# Patient Record
Sex: Female | Born: 1980 | Race: Black or African American | Hispanic: No | Marital: Single | Smoking: Never smoker
Health system: Southern US, Community
[De-identification: ages and names within clinical notes are randomized; demographics above are authoritative.]

## PROBLEM LIST (undated history)

## (undated) DIAGNOSIS — D219 Benign neoplasm of connective and other soft tissue, unspecified: Secondary | ICD-10-CM

## (undated) DIAGNOSIS — D649 Anemia, unspecified: Secondary | ICD-10-CM

---

## 2010-03-05 ENCOUNTER — Emergency Department (HOSPITAL_COMMUNITY): Admission: EM | Admit: 2010-03-05 | Discharge: 2010-03-05 | Payer: Self-pay | Admitting: Emergency Medicine

## 2010-05-04 ENCOUNTER — Emergency Department (HOSPITAL_COMMUNITY): Admission: EM | Admit: 2010-05-04 | Discharge: 2010-05-04 | Payer: Self-pay | Admitting: Emergency Medicine

## 2010-12-10 ENCOUNTER — Emergency Department (INDEPENDENT_AMBULATORY_CARE_PROVIDER_SITE_OTHER): Payer: BC Managed Care – PPO

## 2010-12-10 ENCOUNTER — Emergency Department (HOSPITAL_BASED_OUTPATIENT_CLINIC_OR_DEPARTMENT_OTHER)
Admission: EM | Admit: 2010-12-10 | Discharge: 2010-12-10 | Disposition: A | Discharge status: Home / Self Care | Payer: BC Managed Care – PPO | Attending: Emergency Medicine | Admitting: Emergency Medicine

## 2010-12-10 DIAGNOSIS — R42 Dizziness and giddiness: Secondary | ICD-10-CM

## 2010-12-10 DIAGNOSIS — R51 Headache: Secondary | ICD-10-CM

## 2010-12-10 LAB — GRAM STAIN

## 2010-12-10 LAB — CSF CELL COUNT WITH DIFFERENTIAL

## 2010-12-10 LAB — GLUCOSE, CSF: Glucose, CSF: 47 mg/dL (ref 43–76)

## 2010-12-13 LAB — CSF CULTURE W GRAM STAIN: Culture: NO GROWTH

## 2011-03-07 ENCOUNTER — Emergency Department (HOSPITAL_BASED_OUTPATIENT_CLINIC_OR_DEPARTMENT_OTHER)
Admission: EM | Admit: 2011-03-07 | Discharge: 2011-03-07 | Disposition: A | Discharge status: Home / Self Care | Payer: BC Managed Care – PPO | Attending: Emergency Medicine | Admitting: Emergency Medicine

## 2011-03-07 DIAGNOSIS — H6120 Impacted cerumen, unspecified ear: Secondary | ICD-10-CM | POA: Insufficient documentation

## 2011-09-28 IMAGING — CR DG CHEST 2V
2 series · 2 of 2 positions shown · non-contrast
Comparison: None.

CLINICAL DATA: Fever, chills, headache, chest pain for 2-4 days

CHEST - 2 VIEW

[w chest pa]
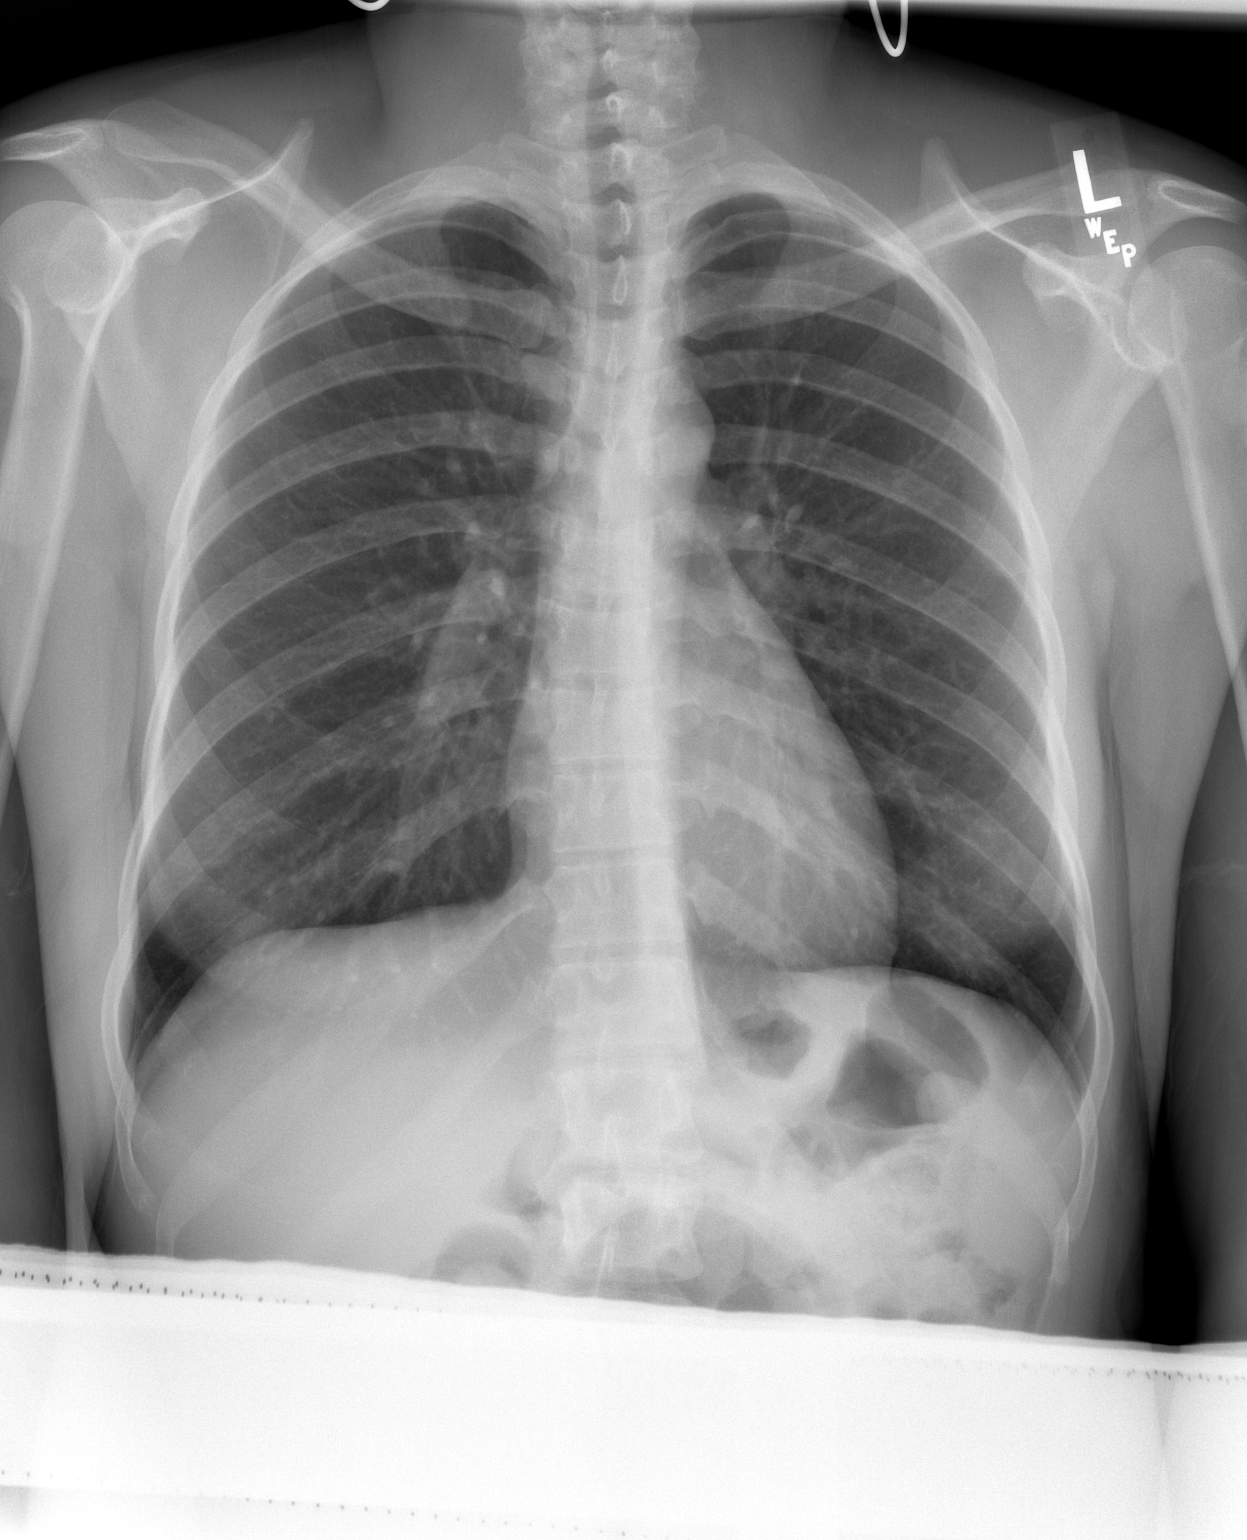

[w chest lat]
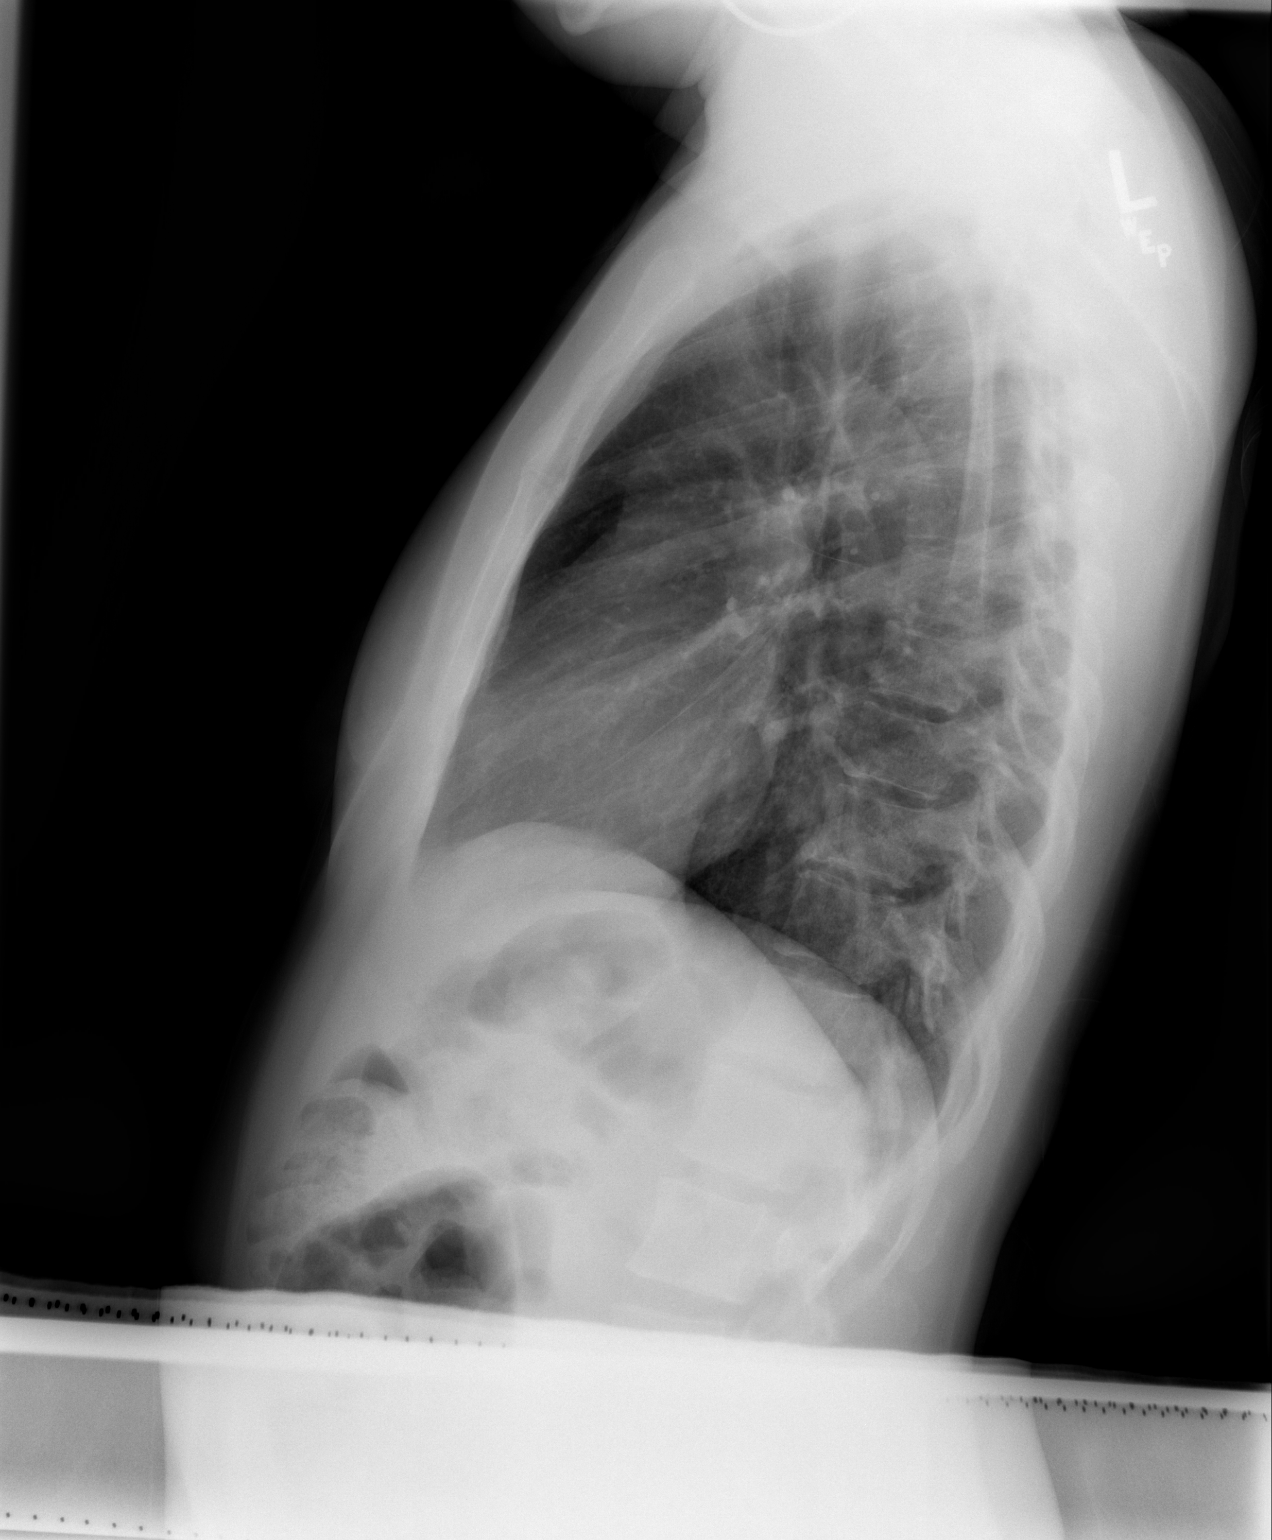

[2 of 2 positions shown; findings below may reference images not displayed]

FINDINGS: The lungs are clear.  Mediastinal contours are normal.
The heart is within normal limits in size.  No acute bony
abnormality is seen.
IMPRESSION: No active lung disease.

## 2014-09-11 ENCOUNTER — Emergency Department (HOSPITAL_BASED_OUTPATIENT_CLINIC_OR_DEPARTMENT_OTHER): Payer: BC Managed Care – PPO

## 2014-09-11 ENCOUNTER — Encounter (HOSPITAL_BASED_OUTPATIENT_CLINIC_OR_DEPARTMENT_OTHER): Payer: Self-pay | Admitting: Emergency Medicine

## 2014-09-11 DIAGNOSIS — Z8742 Personal history of other diseases of the female genital tract: Secondary | ICD-10-CM | POA: Insufficient documentation

## 2014-09-11 DIAGNOSIS — J069 Acute upper respiratory infection, unspecified: Secondary | ICD-10-CM | POA: Diagnosis not present

## 2014-09-11 DIAGNOSIS — R05 Cough: Secondary | ICD-10-CM | POA: Diagnosis present

## 2014-09-11 DIAGNOSIS — Z862 Personal history of diseases of the blood and blood-forming organs and certain disorders involving the immune mechanism: Secondary | ICD-10-CM | POA: Insufficient documentation

## 2014-09-11 DIAGNOSIS — Z3202 Encounter for pregnancy test, result negative: Secondary | ICD-10-CM | POA: Insufficient documentation

## 2014-09-11 DIAGNOSIS — M549 Dorsalgia, unspecified: Secondary | ICD-10-CM | POA: Diagnosis not present

## 2014-09-11 NOTE — ED Notes (Signed)
Pt reports cold symptom x1 month worsen over last several days

## 2014-09-11 NOTE — ED Provider Notes (Signed)
CSN: [id number]     Arrival date & time 09/11/14  2254 History  This chart was scribed for Gloria Hawes Alfonso Patten, MD by Edison Simon, ED Scribe. This patient was seen in room MH04/MH04 and the patient's care was started at 12:25 AM.    Chief Complaint  Patient presents with  . Influenza   Patient is a 33 y.o. female presenting with flu symptoms. The history is provided by the patient. No language interpreter was used.  Influenza Presenting symptoms: cough   Presenting symptoms: no fever, no myalgias, no shortness of breath, no sore throat and no vomiting   Severity:  Moderate Onset quality:  Gradual Duration: 1 month. Progression:  Worsening Chronicity:  New Relieved by: Advil, Robitussin. Worsened by:  Nothing tried Ineffective treatments:  None tried Associated symptoms: nasal congestion   Associated symptoms: no neck stiffness   Risk factors: no kidney disease     HPI Comments: [name] is a 33 y.o. female who presents to the Emergency Department complaining of cough with onset 1 month ago, worse today. She also complains of nasal congestion. She states she used some Advil at home with slight improvement and has used Robitussin also.  Past Medical History  Diagnosis Date  . Anemia   . Fibroid    History reviewed. No pertinent past surgical history. History reviewed. No pertinent family history. History  Substance Use Topics  . Smoking status: Never Smoker   . Smokeless tobacco: Never Used  . Alcohol Use: Yes   OB History    No data available     Review of Systems  Constitutional: Negative for fever.  HENT: Positive for congestion. Negative for sore throat.   Eyes: Negative for photophobia.  Respiratory: Positive for cough. Negative for shortness of breath.   Gastrointestinal: Negative for vomiting.  Musculoskeletal: Positive for back pain. Negative for myalgias and neck stiffness.  All other systems reviewed and are negative.     Allergies  Review of  patient's allergies indicates no known allergies.  Home Medications   Prior to Admission medications   Not on File   BP 114/63 mmHg  Pulse 75  Temp(Src) 98.4 F (36.9 C) (Oral)  Resp 18  Ht 4\' 10"  (1.473 m)  Wt 135 lb (61.236 kg)  BMI 28.22 kg/m2  SpO2 98%  LMP 09/04/2014 Physical Exam  Constitutional: She is oriented to person, place, and time. She appears well-developed and well-nourished.  HENT:  Head: Normocephalic and atraumatic.  Right Ear: Tympanic membrane normal.  Left Ear: Tympanic membrane normal.  Mouth/Throat: Oropharynx is clear and moist. No oropharyngeal exudate.  Clear colorless postnasal drip with cobblestoning  Eyes: Conjunctivae are normal. Pupils are equal, round, and reactive to light.  Neck: Normal range of motion. Neck supple. No tracheal deviation present.  Cardiovascular: Normal rate, regular rhythm and intact distal pulses.   Pulmonary/Chest: Effort normal and breath sounds normal. No respiratory distress. She has no wheezes. She has no rales.  Abdominal: Soft. Bowel sounds are normal. She exhibits no distension. There is no tenderness. There is no rebound and no guarding.  Musculoskeletal: Normal range of motion.  Lymphadenopathy:    She has no cervical adenopathy.  Neurological: She is alert and oriented to person, place, and time.  Skin: Skin is warm and dry.  Psychiatric: She has a normal mood and affect.  Nursing note and vitals reviewed.   ED Course  Procedures (including critical care time)  DIAGNOSTIC STUDIES: Oxygen Saturation is 98% on room air,  normal by my interpretation.    COORDINATION OF CARE: 12:27 AM Discussed treatment plan with patient at beside, the patient agrees with the plan and has no further questions at this time.   Labs Review Labs Reviewed  PREGNANCY, URINE  URINALYSIS, ROUTINE W REFLEX MICROSCOPIC    Imaging Review No results found.   EKG Interpretation None      MDM   Final diagnoses:  URI  (upper respiratory infection)   No fevers, symptoms have been going on for a month and are consistent with post nasal drip.  Without fevers or nasal discharge there are not indications for antibiotics will treat for pain and congestion.    I personally performed the services described in this documentation, which was scribed in my presence. The recorded information has been reviewed and is accurate.     Carlisle Beers, MD 09/12/14 0111

## 2014-09-12 ENCOUNTER — Emergency Department (HOSPITAL_BASED_OUTPATIENT_CLINIC_OR_DEPARTMENT_OTHER)
Admission: EM | Admit: 2014-09-12 | Discharge: 2014-09-12 | Disposition: A | Discharge status: Home / Self Care | Payer: BC Managed Care – PPO | Attending: Emergency Medicine | Admitting: Emergency Medicine

## 2014-09-12 ENCOUNTER — Encounter (HOSPITAL_BASED_OUTPATIENT_CLINIC_OR_DEPARTMENT_OTHER): Payer: Self-pay | Admitting: Emergency Medicine

## 2014-09-12 DIAGNOSIS — R0982 Postnasal drip: Secondary | ICD-10-CM

## 2014-09-12 DIAGNOSIS — J069 Acute upper respiratory infection, unspecified: Secondary | ICD-10-CM

## 2014-09-12 HISTORY — DX: Anemia, unspecified: D64.9

## 2014-09-12 HISTORY — DX: Benign neoplasm of connective and other soft tissue, unspecified: D21.9

## 2014-09-12 LAB — URINALYSIS, ROUTINE W REFLEX MICROSCOPIC
Bilirubin Urine: NEGATIVE
Glucose, UA: NEGATIVE mg/dL
Ketones, ur: NEGATIVE mg/dL
Leukocytes, UA: NEGATIVE
Nitrite: NEGATIVE
PH: 6 (ref 5.0–8.0)
PROTEIN: NEGATIVE mg/dL
SPECIFIC GRAVITY, URINE: 1.015 (ref 1.005–1.030)
Urobilinogen, UA: 1 mg/dL (ref 0.0–1.0)

## 2014-09-12 LAB — URINE MICROSCOPIC-ADD ON

## 2014-09-12 LAB — PREGNANCY, URINE: PREG TEST UR: NEGATIVE

## 2014-09-12 MED ORDER — BENZONATATE 100 MG PO CAPS: 100.0000 mg | ORAL_CAPSULE | Freq: Three times a day (TID) | ORAL | Status: DC

## 2014-09-12 MED ORDER — DESLORATADINE-PSEUDOEPHED ER 2.5-120 MG PO TB12: 1.0000 | ORAL_TABLET | Freq: Two times a day (BID) | ORAL | Status: DC

## 2014-09-12 MED ORDER — BENZONATATE 100 MG PO CAPS
200.0000 mg | ORAL_CAPSULE | Freq: Once | ORAL | Status: AC
  Administered 2014-09-12: 200 mg via ORAL
  Filled 2014-09-12: qty 2

## 2014-09-12 MED ORDER — FLUTICASONE PROPIONATE 50 MCG/ACT NA SUSP: 2.0000 | Freq: Every day | NASAL | Status: DC

## 2014-09-12 MED ORDER — LORATADINE 10 MG PO TABS
10.0000 mg | ORAL_TABLET | Freq: Once | ORAL | Status: AC
  Administered 2014-09-12: 10 mg via ORAL
  Filled 2014-09-12: qty 1

## 2014-09-12 MED ORDER — NAPROXEN 250 MG PO TABS
500.0000 mg | ORAL_TABLET | Freq: Once | ORAL | Status: AC
  Administered 2014-09-12: 500 mg via ORAL
  Filled 2014-09-12: qty 2

## 2014-09-12 MED ORDER — MELOXICAM 7.5 MG PO TABS: 7.5000 mg | ORAL_TABLET | Freq: Every day | ORAL | Status: DC

## 2014-09-12 NOTE — Discharge Instructions (Signed)
Cool Mist Vaporizers °Vaporizers may help relieve the symptoms of a cough and cold. They add moisture to the air, which helps mucus to become thinner and less sticky. This makes it easier to breathe and cough up secretions. Cool mist vaporizers do not cause serious burns like hot mist vaporizers, which may also be called steamers or humidifiers. Vaporizers have not been proven to help with colds. You should not use a vaporizer if you are allergic to mold. °HOME CARE INSTRUCTIONS °· Follow the package instructions for the vaporizer. °· Do not use anything other than distilled water in the vaporizer. °· Do not run the vaporizer all of the time. This can cause mold or bacteria to grow in the vaporizer. °· Clean the vaporizer after each time it is used. °· Clean and dry the vaporizer well before storing it. °· Stop using the vaporizer if worsening respiratory symptoms develop. °Document Released: 05/30/2004 Document Revised: 09/07/2013 Document Reviewed: 01/20/2013 °[employer]® Patient Information ©2015 [employer]. This information is not intended to replace advice given to you by your health care provider. Make sure you discuss any questions you have with your health care provider. ° °

## 2015-01-13 ENCOUNTER — Emergency Department (HOSPITAL_BASED_OUTPATIENT_CLINIC_OR_DEPARTMENT_OTHER)
Admission: EM | Admit: 2015-01-13 | Discharge: 2015-01-13 | Disposition: A | Discharge status: Home / Self Care | Payer: BLUE CROSS/BLUE SHIELD | Attending: Emergency Medicine | Admitting: Emergency Medicine

## 2015-01-13 ENCOUNTER — Encounter (HOSPITAL_BASED_OUTPATIENT_CLINIC_OR_DEPARTMENT_OTHER): Payer: Self-pay

## 2015-01-13 DIAGNOSIS — Z86018 Personal history of other benign neoplasm: Secondary | ICD-10-CM | POA: Diagnosis not present

## 2015-01-13 DIAGNOSIS — Z9889 Other specified postprocedural states: Secondary | ICD-10-CM | POA: Insufficient documentation

## 2015-01-13 DIAGNOSIS — M25512 Pain in left shoulder: Secondary | ICD-10-CM | POA: Diagnosis not present

## 2015-01-13 DIAGNOSIS — N898 Other specified noninflammatory disorders of vagina: Secondary | ICD-10-CM | POA: Insufficient documentation

## 2015-01-13 DIAGNOSIS — Z3202 Encounter for pregnancy test, result negative: Secondary | ICD-10-CM | POA: Diagnosis not present

## 2015-01-13 DIAGNOSIS — N949 Unspecified condition associated with female genital organs and menstrual cycle: Secondary | ICD-10-CM

## 2015-01-13 DIAGNOSIS — Z862 Personal history of diseases of the blood and blood-forming organs and certain disorders involving the immune mechanism: Secondary | ICD-10-CM | POA: Diagnosis not present

## 2015-01-13 LAB — URINALYSIS, ROUTINE W REFLEX MICROSCOPIC
BILIRUBIN URINE: NEGATIVE
GLUCOSE, UA: NEGATIVE mg/dL
Ketones, ur: NEGATIVE mg/dL
Nitrite: NEGATIVE
PH: 6 (ref 5.0–8.0)
Protein, ur: NEGATIVE mg/dL
SPECIFIC GRAVITY, URINE: 1.022 (ref 1.005–1.030)
UROBILINOGEN UA: 1 mg/dL (ref 0.0–1.0)

## 2015-01-13 LAB — PREGNANCY, URINE: PREG TEST UR: NEGATIVE

## 2015-01-13 LAB — URINE MICROSCOPIC-ADD ON

## 2015-01-13 MED ORDER — KETOROLAC TROMETHAMINE 60 MG/2ML IM SOLN
30.0000 mg | Freq: Once | INTRAMUSCULAR | Status: AC
  Administered 2015-01-13: 30 mg via INTRAMUSCULAR
  Filled 2015-01-13: qty 2

## 2015-01-13 MED ORDER — IBUPROFEN 800 MG PO TABS: 800.0000 mg | ORAL_TABLET | Freq: Three times a day (TID) | ORAL | Status: AC

## 2015-01-13 MED ORDER — DIAZEPAM 5 MG PO TABS: 5.0000 mg | ORAL_TABLET | Freq: Two times a day (BID) | ORAL | Status: AC

## 2015-01-13 NOTE — ED Provider Notes (Signed)
CSN: [id number]     Arrival date & time 01/13/15  0813 History   First MD Initiated Contact with Patient 01/13/15 0820     Chief Complaint  Patient presents with  . Vaginal Discharge     (Consider location/radiation/quality/duration/timing/severity/associated sxs/prior Treatment) HPI Patient presents with 2 chief complaints, each of which has been present for months. Patient has a day off from work today. Chief complaint #1 left shoulder pain. Several months ago the patient began to develop pain throughout the left shoulder and left arm. Initially there was upper thoracic back pain, but gradually the pain shifted and encompasses the entire left arm, with dysesthesia, heaviness, but with no loss of sensation or strength. 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 No oral medication taken thus far. No ongoing upper back pain, neck pain. Patient has a history of migraine headaches, and continues to have these, unchanged.  Chief complaint #2 vaginal discomfort. For several months she's had vaginal irritation, discharge, without true dysuria, hematuria. There is intermittent lower abdominal pain, unchanged as well.  With history of fibroids, but no prior intervention. Patient has not seen her gynecologist throughout the months of vaginal discomfort. Patient started her menstrual cycle today.  Past Medical History  Diagnosis Date  . Anemia   . Fibroid    Past Surgical History  Procedure Laterality Date  . Cesarean section     No family history on file. History  Substance Use Topics  . Smoking status: Never Smoker   . Smokeless tobacco: Never Used  . Alcohol Use: Yes     Comment: occasional   OB History    No data available     Review of Systems  Constitutional:       Per HPI, otherwise negative  HENT:       Per HPI, otherwise negative  Respiratory:       Per HPI, otherwise negative    Cardiovascular:       Per HPI, otherwise negative  Gastrointestinal: Negative for vomiting.  Endocrine:       Negative aside from HPI  Genitourinary:       Neg aside from HPI   Musculoskeletal:       Per HPI, otherwise negative  Skin: Negative.   Neurological: Negative for syncope.      Allergies  Bactrim and Keflex  Home Medications   Prior to Admission medications   Not on File   BP 117/49 mmHg  Pulse 79  Temp(Src) 98.4 F (36.9 C) (Oral)  Resp 18  Ht 4\' 10"  (1.473 m)  Wt 141 lb (63.957 kg)  BMI 29.48 kg/m2  SpO2 100%  LMP 01/13/2015 (Exact Date) Physical Exam  Constitutional: She is oriented to person, place, and time. She appears well-developed and well-nourished. No distress.  HENT:  Head: Normocephalic and atraumatic.  Eyes: Conjunctivae and EOM are normal.  Neck:    Cardiovascular: Normal rate and regular rhythm.   Pulmonary/Chest: Effort normal and breath sounds normal. No stridor. No respiratory distress.  Abdominal: She exhibits no distension. There is no tenderness.  No lower abdominal discomfort, guarding, rebound  Musculoskeletal: She exhibits no edema.  Patient is wearing a left wrist splint, but there is no deformity, no tenderness to palpation about the wrist. Range of motion of the wrist, elbow, shoulder are all normal. There is no gross deformity of any of the head joints. There is exquisite tenderness to palpation with minimal pressure throughout the  trapezius.   Neurological: She is alert and oriented to person, place, and time. No cranial nerve deficit.  Skin: Skin is warm and dry.  Psychiatric: She has a normal mood and affect.  Nursing note and vitals reviewed.   ED Course  Procedures (including critical care time) Labs Review Labs Reviewed  URINALYSIS, ROUTINE W REFLEX MICROSCOPIC - Abnormal; Notable for the following:    Hgb urine dipstick LARGE (*)    Leukocytes, UA SMALL (*)    All other components within normal limits   URINE MICROSCOPIC-ADD ON - Abnormal; Notable for the following:    Squamous Epithelial / LPF FEW (*)    Bacteria, UA MANY (*)    All other components within normal limits  PREGNANCY, URINE    After the initial physical exam, with concern for cold vaginal issues, we discussed the utility of doing a pelvic exam in light of new menstrual bleeding. Given the chronicity of the symptoms, the absence of abdominal pain, absence of abnormal vital signs, she will wait until after her menstrual cycle for evaluation with gynecology.  MDM   Patient resents with 2 chief complaint, neither which is acutely abnormal for her, but both of which are uncomfortable. No evidence for systemic illness, peritonitis, acute neurologic loss. Patient had improvement here with initiation of anti-inflammatories for her shoulder pain. Additional gynecologic exam will occur after menstrual cycle has finished.    Carmin Muskrat, MD 01/13/15 [phone]

## 2015-01-13 NOTE — ED Notes (Signed)
Here for evaluation of left shoulder and arm pain that started 6 months ago. Denies injury. "My boss said I should get it checked out"  Also here with vaginal irritation and discharge that has been present intermittently for months.

## 2015-01-13 NOTE — Discharge Instructions (Signed)
As discussed, your evaluation today has been largely reassuring.  But, it is important that you monitor your condition carefully, and do not hesitate to return to the ED if you develop new, or concerning changes in your condition. ° °Otherwise, please follow-up with your physicians for appropriate ongoing care. ° °

## 2015-01-13 NOTE — ED Notes (Signed)
MD at bedside. 

## 2016-02-06 IMAGING — CR DG CHEST 2V
2 series · 2 of 2 positions shown · non-contrast
Comparison: 05/04/2010

CLINICAL DATA: Cough, onset 1 month ago.  Worse today.

EXAM:
CHEST  2 VIEW

[w chest pa]
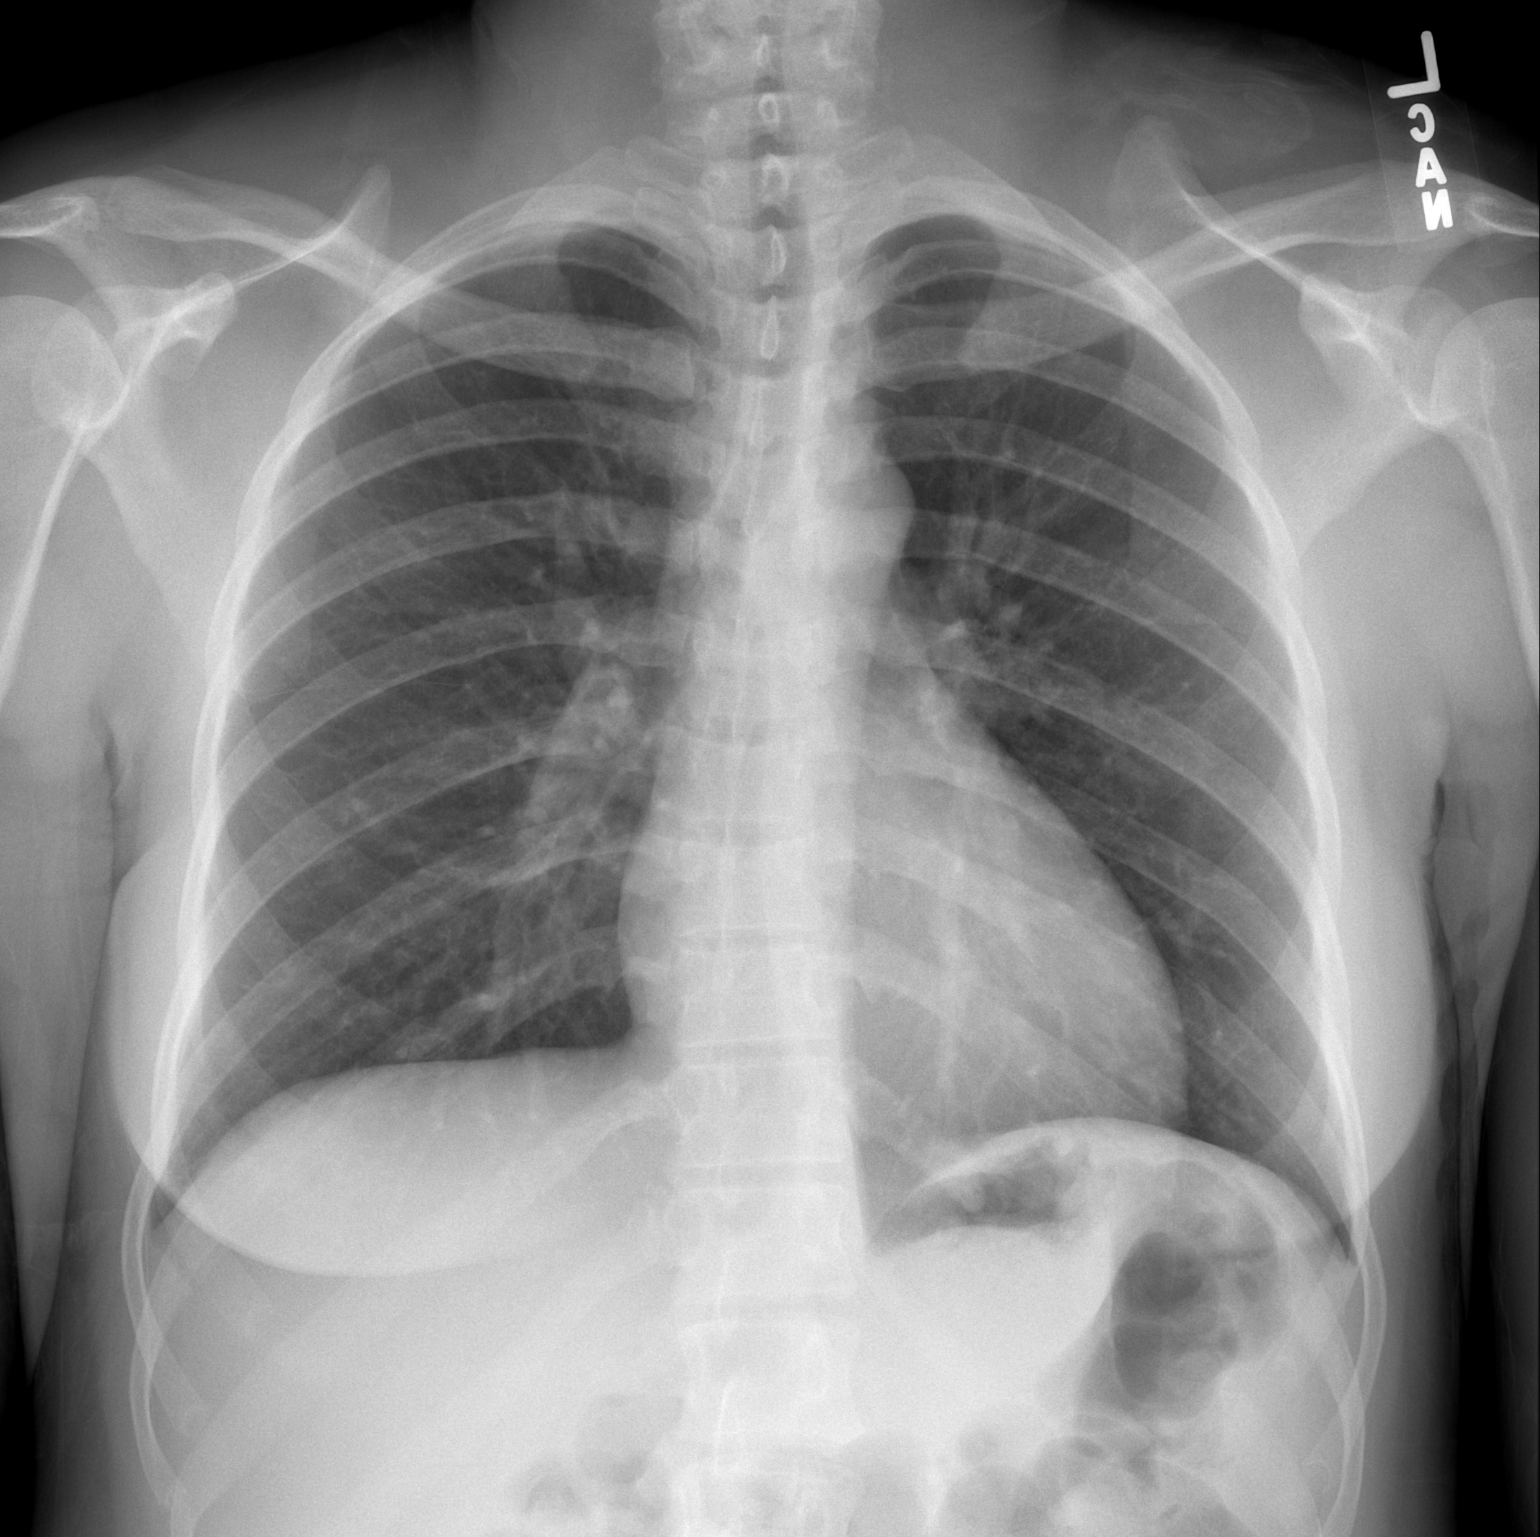

[w chest lat]
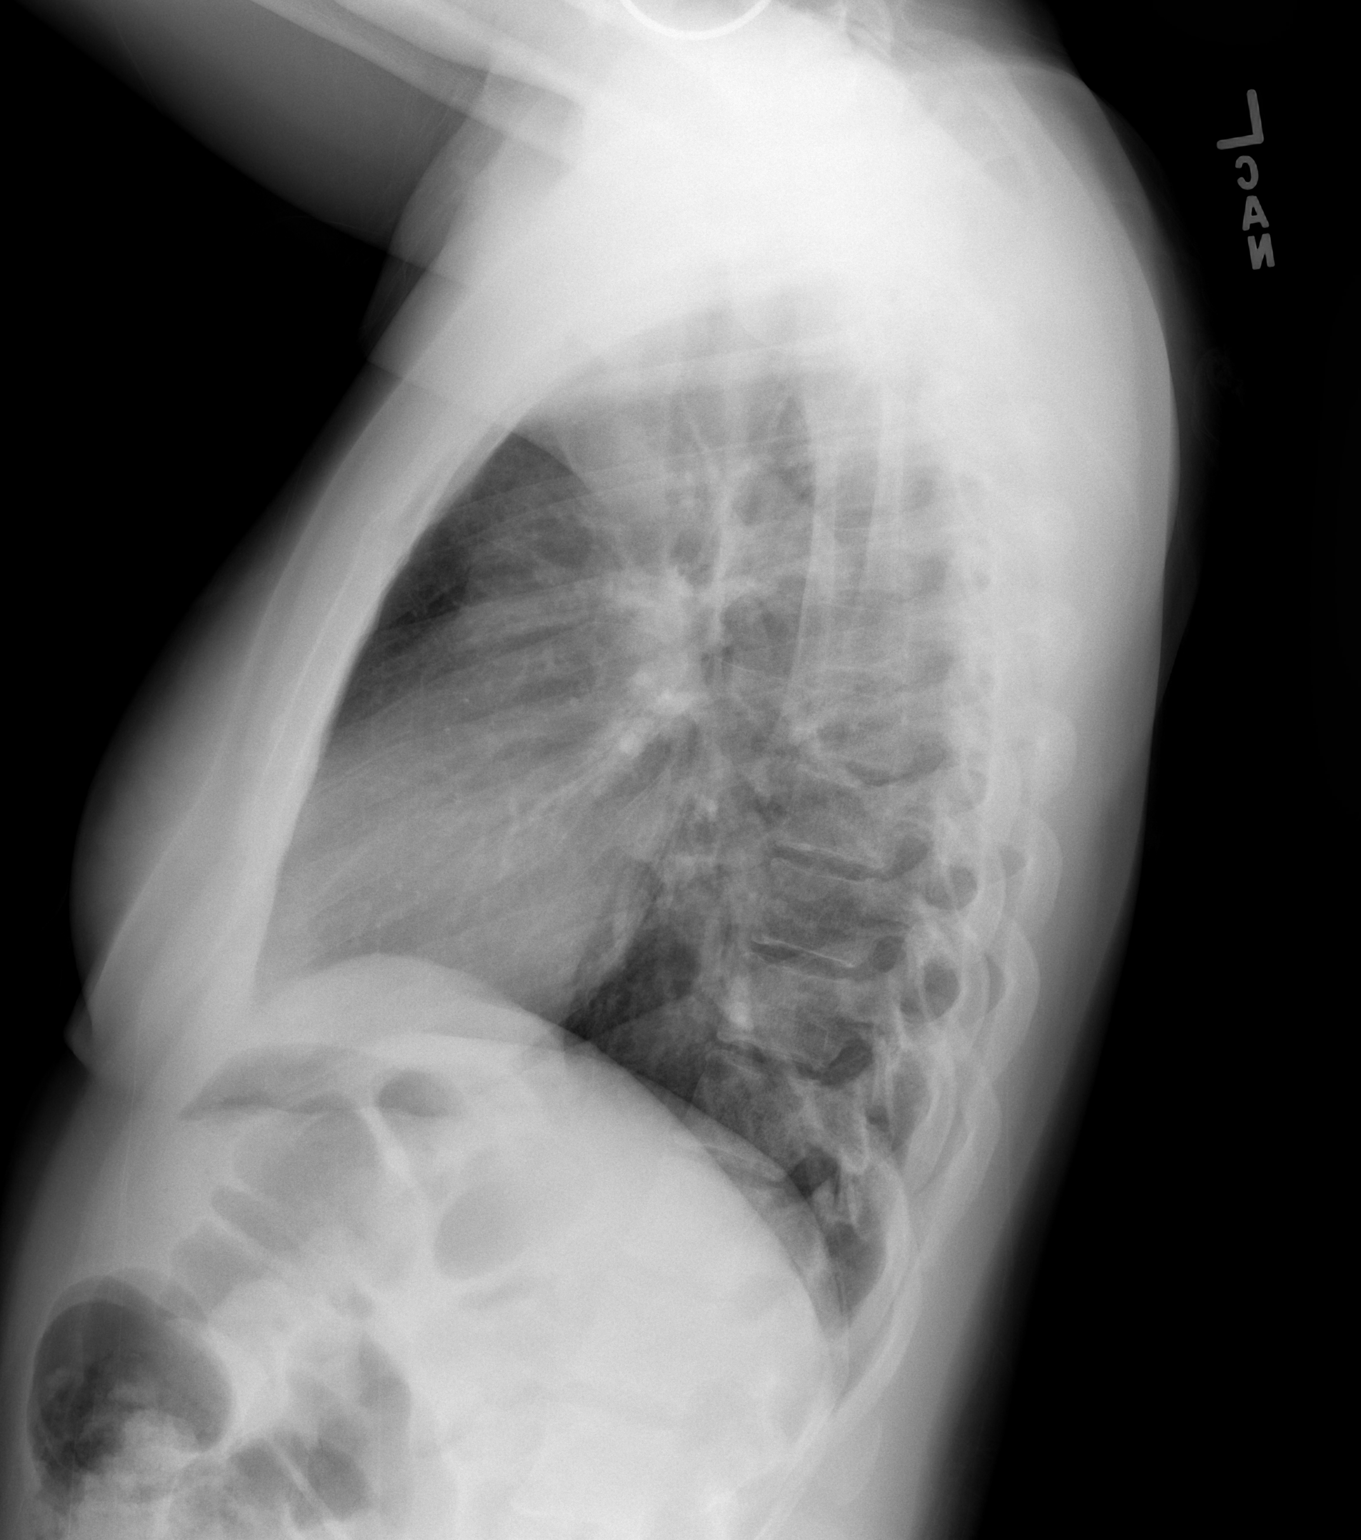

[2 of 2 positions shown; findings below may reference images not displayed]

FINDINGS: The heart size and mediastinal contours are within normal limits.
Both lungs are clear. The visualized skeletal structures are
unremarkable.
IMPRESSION: No active cardiopulmonary disease.
